# Patient Record
Sex: Male | Born: 1955 | Race: White | Hispanic: No | Marital: Single | Smoking: Current every day smoker
Health system: Southern US, Community
[De-identification: ages and names within clinical notes are randomized; demographics above are authoritative.]

## PROBLEM LIST (undated history)

## (undated) DIAGNOSIS — B1920 Unspecified viral hepatitis C without hepatic coma: Secondary | ICD-10-CM

## (undated) DIAGNOSIS — T148XXD Other injury of unspecified body region, subsequent encounter: Secondary | ICD-10-CM

## (undated) DIAGNOSIS — K6389 Other specified diseases of intestine: Secondary | ICD-10-CM

## (undated) HISTORY — DX: Unspecified viral hepatitis C without hepatic coma: B19.20

## (undated) HISTORY — DX: Other specified diseases of intestine: K63.89

## (undated) HISTORY — DX: Other injury of unspecified body region, subsequent encounter: T14.8XXD

---

## 2004-08-29 ENCOUNTER — Emergency Department: Payer: Self-pay | Admitting: Unknown Physician Specialty

## 2005-08-31 ENCOUNTER — Emergency Department: Payer: Self-pay | Admitting: Emergency Medicine

## 2020-05-28 ENCOUNTER — Telehealth: Payer: Self-pay

## 2020-05-28 ENCOUNTER — Emergency Department: Payer: Self-pay

## 2020-05-28 ENCOUNTER — Other Ambulatory Visit: Payer: Self-pay

## 2020-05-28 ENCOUNTER — Encounter: Payer: Self-pay | Admitting: Emergency Medicine

## 2020-05-28 ENCOUNTER — Emergency Department
Admission: EM | Admit: 2020-05-28 | Discharge: 2020-05-28 | Disposition: A | Discharge status: Home / Self Care | Payer: Self-pay | Attending: Emergency Medicine | Admitting: Emergency Medicine

## 2020-05-28 DIAGNOSIS — C787 Secondary malignant neoplasm of liver and intrahepatic bile duct: Secondary | ICD-10-CM | POA: Insufficient documentation

## 2020-05-28 DIAGNOSIS — B9689 Other specified bacterial agents as the cause of diseases classified elsewhere: Secondary | ICD-10-CM | POA: Insufficient documentation

## 2020-05-28 DIAGNOSIS — G8929 Other chronic pain: Secondary | ICD-10-CM | POA: Insufficient documentation

## 2020-05-28 DIAGNOSIS — N3000 Acute cystitis without hematuria: Secondary | ICD-10-CM | POA: Insufficient documentation

## 2020-05-28 DIAGNOSIS — F17210 Nicotine dependence, cigarettes, uncomplicated: Secondary | ICD-10-CM | POA: Insufficient documentation

## 2020-05-28 DIAGNOSIS — C187 Malignant neoplasm of sigmoid colon: Secondary | ICD-10-CM | POA: Insufficient documentation

## 2020-05-28 LAB — URINALYSIS, COMPLETE (UACMP) WITH MICROSCOPIC
Glucose, UA: NEGATIVE mg/dL
Ketones, ur: 5 mg/dL — AB
Nitrite: POSITIVE — AB
Protein, ur: 100 mg/dL — AB
Specific Gravity, Urine: 1.028 (ref 1.005–1.030)
WBC, UA: >50 WBC/hpf — ABNORMAL HIGH (ref 0–5)
pH: 5 (ref 5.0–8.0)

## 2020-05-28 LAB — COMPREHENSIVE METABOLIC PANEL
ALT: 6 U/L (ref 0–44)
AST: 12 U/L — ABNORMAL LOW (ref 15–41)
Albumin: 3.2 g/dL — ABNORMAL LOW (ref 3.5–5.0)
Alkaline Phosphatase: 53 U/L (ref 38–126)
Anion gap: 9 (ref 5–15)
BUN: 24 mg/dL — ABNORMAL HIGH (ref 8–23)
CO2: 24 mmol/L (ref 22–32)
Calcium: 8.9 mg/dL (ref 8.9–10.3)
Chloride: 99 mmol/L (ref 98–111)
Creatinine, Ser: 0.8 mg/dL (ref 0.61–1.24)
GFR, Estimated: >60 mL/min (ref >60)
Glucose, Bld: 110 mg/dL — ABNORMAL HIGH (ref 70–99)
Potassium: 3.9 mmol/L (ref 3.5–5.1)
Sodium: 132 mmol/L — ABNORMAL LOW (ref 135–145)
Total Bilirubin: 0.5 mg/dL (ref 0.3–1.2)
Total Protein: 8.5 g/dL — ABNORMAL HIGH (ref 6.5–8.1)

## 2020-05-28 LAB — CBC
HCT: 27.4 % — ABNORMAL LOW (ref 39.0–52.0)
Hemoglobin: 8 g/dL — ABNORMAL LOW (ref 13.0–17.0)
MCH: 19.5 pg — ABNORMAL LOW (ref 26.0–34.0)
MCHC: 29.2 g/dL — ABNORMAL LOW (ref 30.0–36.0)
MCV: 66.7 fL — ABNORMAL LOW (ref 80.0–100.0)
Platelets: 620 10*3/uL — ABNORMAL HIGH (ref 150–400)
RBC: 4.11 MIL/uL — ABNORMAL LOW (ref 4.22–5.81)
RDW: 17.5 % — ABNORMAL HIGH (ref 11.5–15.5)
WBC: 11.1 10*3/uL — ABNORMAL HIGH (ref 4.0–10.5)
nRBC: 0 % (ref 0.0–0.2)

## 2020-05-28 LAB — LIPASE, BLOOD: Lipase: 22 U/L (ref 11–51)

## 2020-05-28 MED ORDER — IOHEXOL 300 MG/ML  SOLN
100.0000 mL | Freq: Once | INTRAMUSCULAR | Status: AC | PRN
  Administered 2020-05-28: 100 mL via INTRAVENOUS

## 2020-05-28 MED ORDER — CEPHALEXIN 500 MG PO CAPS: 500.0000 mg | ORAL_CAPSULE | Freq: Four times a day (QID) | ORAL | 0 refills | Status: AC

## 2020-05-28 MED ORDER — OXYCODONE HCL 5 MG PO TABS
5.0000 mg | ORAL_TABLET | Freq: Three times a day (TID) | ORAL | 0 refills | Status: DC | PRN
Start: 2020-05-28 — End: 2020-05-31

## 2020-05-28 MED ORDER — SODIUM CHLORIDE 0.9 % IV SOLN
1.0000 g | Freq: Once | INTRAVENOUS | Status: AC
  Administered 2020-05-28: 1 g via INTRAVENOUS
  Filled 2020-05-28: qty 10

## 2020-05-28 MED ORDER — LACTATED RINGERS IV BOLUS: 1000.0000 mL | Freq: Once | INTRAVENOUS | Status: DC

## 2020-05-28 MED ORDER — MORPHINE SULFATE (PF) 4 MG/ML IV SOLN
4.0000 mg | Freq: Once | INTRAVENOUS | Status: AC
  Administered 2020-05-28: 4 mg via INTRAVENOUS
  Filled 2020-05-28: qty 1

## 2020-05-28 NOTE — ED Notes (Signed)
Patient sister at bedside

## 2020-05-28 NOTE — Telephone Encounter (Signed)
Call was received to the cancer center from Mr. Jose Watts. He was in the ED today and was told he had colon cancer and that he needed to see Dr. Rogue Bussing in clinic. Imaging reviewed. New patient scheduler will work with Dr. 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

## 2020-05-28 NOTE — Discharge Instructions (Signed)
As we discussed, you likely have an advanced cancer of your colon that has spread to your liver and is eroding the wall of your bladder, causing your UTI.  You are being discharged with 2 prescriptions. Keflex antibiotic to take 4 times daily for the next 10 days to treat bladder infection.  Please finish all of these pills. Oxycodone pain medication to use as needed for severe pain.  Use Tylenol for pain and fevers.  Up to 1000 mg per dose, up to 4 times per day.  Do not take more than 4000 mg of Tylenol/acetaminophen within 24 hours..  I have attached the phone numbers for various specialists and for a new PCP.  I would urge you to call all of them and set up appointments soon as possible.  If you develop any significantly worsening symptoms despite the above medications, please return to the ED.

## 2020-05-28 NOTE — ED Triage Notes (Signed)
Pt presents to ED via POV with c/o LLQ since Sept 2020. Pt also c/o diarrhea. Pt presents A&O x4 and ambulatory with [employer] in hand. Pt states pain relieved after having bowel movement. Pt states urination q 1-2 hrs. Pt also c/o dysuria with urination.   Pt also states he feels like he has diabetes due to feeling like he doesn't heal as quickly as he used to and increased cravings for sweets.

## 2020-05-28 NOTE — ED Notes (Signed)
Patient discharged home with sister, patient received discharge papers and prescriptions to pick up. Patient appropriate and cooperative. Vital signs taken. NAD noted.

## 2020-05-28 NOTE — ED Notes (Signed)
First Nurse Note: Pt ambulatory into ED c/o lower abd pain. Pt is in NAD.

## 2020-05-28 NOTE — ED Provider Notes (Signed)
[facility] Emergency Department Provider Note ____________________________________________   First MD Initiated Contact with Patient 05/28/20 [phone]     (approximate)  I have reviewed the triage vital signs and the nursing notes.  HISTORY  Chief Complaint Abdominal Pain   HPI Jose Watts is a 64 y.o. malewho presents to the ED for evaluation of abdominal pain and dysuria  Chart review indicates patient never been in our system.  Patient reports a strong paternal family history of colon cancer.  Patient presents to the ED with acute on chronic LLQ abdominal pain, now superimposed with dysuria and urinary frequency.  Patient denies any fevers, nausea, emesis, flank pain, syncope, chest pain, shortness of breath or cough.  He indicates that he has had many months of intermittent LLQ abdominal pain, acutely worsening over the past couple days.  He reports loose stools throughout this entire timeframe, but denies melena or hematochezia.  Denies upper GI symptoms.  He reports dysuria and urinary frequency for the past few days without hematuria.  He does report a 50 pound weight loss that was unintentional over the past 1 year.  Reports he has a elderly mother at home with dementia that he cares for her and can only stay for a couple hours.  He reports that he was a previous drinker, and continues to be a regular cigarette smoker.  History reviewed. No pertinent past medical history.  There are no problems to display for this patient.   History reviewed. No pertinent surgical history.  Prior to Admission medications   Medication Sig Start Date End Date Taking? Authorizing Provider  cephALEXin (KEFLEX) 500 MG capsule Take 1 capsule (500 mg total) by mouth 4 (four) times daily for 10 days. 05/28/20 06/07/20  Vladimir Crofts, MD  oxyCODONE (ROXICODONE) 5 MG immediate release tablet Take 1 tablet (5 mg total) by mouth every 8 (eight) hours as needed. 05/28/20  05/28/21  Vladimir Crofts, MD    Allergies Patient has no known allergies.  No family history on file.  Social History Social History   Tobacco Use  . Smoking status: Current Every Day Smoker    Types: Cigarettes  . Smokeless tobacco: Former Network engineer Use Topics  . Alcohol use: Not Currently  . Drug use: Yes    Types: Marijuana    Comment: Every 2 days    Review of Systems  Constitutional: No fever/chills.  Positive for unintentional weight loss Eyes: No visual changes. ENT: No sore throat. Cardiovascular: Denies chest pain. Respiratory: Denies shortness of breath. Gastrointestinal:No nausea, no vomiting.  No constipation. Positive for abdominal pain and diarrhea. Genitourinary: Positive for dysuria. Musculoskeletal: Negative for back pain. Skin: Negative for rash. Neurological: Negative for headaches, focal weakness or numbness.  ____________________________________________   PHYSICAL EXAM:  VITAL SIGNS: Vitals:   05/28/20 0959 05/28/20 1140  BP:  130/88  Pulse:  88  Resp: 18 20  Temp:    SpO2:  98%      Constitutional: Alert and oriented. Well appearing and in no acute distress. Eyes: Conjunctivae are normal. PERRL. EOMI. Head: Atraumatic. Nose: No congestion/rhinnorhea. Mouth/Throat: Mucous membranes are dry.  Oropharynx non-erythematous. Neck: No stridor. No cervical spine tenderness to palpation. Cardiovascular: Tachycardic rate, regular rhythm. Grossly normal heart sounds.  Good peripheral circulation. Respiratory: Normal respiratory effort.  No retractions. Lungs CTAB. Gastrointestinal: Soft , nondistended,. No CVA tenderness. Suprapubic and LLQ tenderness to palpation, benign upper abdomen. Musculoskeletal: No lower extremity tenderness nor edema.  No joint effusions.  No signs of acute trauma. Neurologic:  Normal speech and language. No gross focal neurologic deficits are appreciated. No gait instability noted. Skin:  Skin is warm, dry and  intact. No rash noted. Psychiatric: Mood and affect are normal. Speech and behavior are normal.  ____________________________________________   LABS (all labs ordered are listed, but only abnormal results are displayed)  Labs Reviewed  COMPREHENSIVE METABOLIC PANEL - Abnormal; Notable for the following components:      Result Value   Sodium 132 (*)    Glucose, Bld 110 (*)    BUN 24 (*)    Total Protein 8.5 (*)    Albumin 3.2 (*)    AST 12 (*)    All other components within normal limits  CBC - Abnormal; Notable for the following components:   WBC 11.1 (*)    RBC 4.11 (*)    Hemoglobin 8.0 (*)    HCT 27.4 (*)    MCV 66.7 (*)    MCH 19.5 (*)    MCHC 29.2 (*)    RDW 17.5 (*)    Platelets 620 (*)    All other components within normal limits  URINALYSIS, COMPLETE (UACMP) WITH MICROSCOPIC - Abnormal; Notable for the following components:   Color, Urine AMBER (*)    APPearance TURBID (*)    Hgb urine dipstick MODERATE (*)    Bilirubin Urine SMALL (*)    Ketones, ur 5 (*)    Protein, ur 100 (*)    Nitrite POSITIVE (*)    Leukocytes,Ua LARGE (*)    WBC, UA >50 (*)    Bacteria, UA MANY (*)    All other components within normal limits  LIPASE, BLOOD   _________________________________________  RADIOLOGY  ED MD interpretation: CT abdomen/pelvis reviewed by me with evidence of sigmoid mass  Official radiology report(s): CT ABDOMEN PELVIS W CONTRAST  Result Date: 05/28/2020 CLINICAL DATA:  Abdominal pain on the left for several months EXAM: CT ABDOMEN AND PELVIS WITH CONTRAST TECHNIQUE: Multidetector CT imaging of the abdomen and pelvis was performed using the standard protocol following bolus administration of intravenous contrast. CONTRAST:  158mL OMNIPAQUE IOHEXOL 300 MG/ML  SOLN COMPARISON:  None. FINDINGS: Lower chest: Mild scarring is noted in the bases bilaterally. Hepatobiliary: Mild fatty infiltration of the liver is noted. Scattered hypodensities are noted throughout  the liver the majority of which appear to represent cysts. In the caudate lobe however there is a focal 2.5 cm hypodensity with some peripheral enhancement which does not demonstrate filling on delayed images and is suspicious for metastatic disease. A smaller similar lesion is noted anteriorly within the right lobe of the liver best seen on the first image of the delayed series. Gallbladder appears within normal limits. Pancreas: Pancreas is within normal limits. Spleen: Normal in size without focal abnormality. Adrenals/Urinary Tract: Adrenal glands are unremarkable. Kidneys demonstrate a normal enhancement pattern bilaterally. No renal calculi or obstructive changes are seen. Normal excretion is noted on delayed images. Small parapelvic cysts are noted on the left. The bladder is decompressed. Stomach/Bowel: In the midportion of the sigmoid colon there is an area of significant wall thickening and masslike density which extends for several cm in the sigmoid colon. No evidence of perforation is noted. These changes are consistent with a colonic carcinoma and direct visualization with biopsy is recommended. On the coronal imaging the wall of the stomach approaches the superior aspect of the bladder and the possibility of local invasion could not be totally excluded. No findings  to suggest fistulization are noted at this time. The more proximal colon is distended with fecal material consistent with a mild degree of constipation. The appendix is well visualized and within normal limits. Small bowel and stomach appear unremarkable. Vascular/Lymphatic: Aortic atherosclerotic calcifications are noted. No aneurysmal dilatation is noted. No significant lymphadenopathy is identified at this time. Reproductive: Prostate is unremarkable. Other: No abdominal wall hernia or abnormality. No abdominopelvic ascites. Musculoskeletal: No acute or significant osseous findings. IMPRESSION: Changes consistent with sigmoid carcinoma  till proven otherwise. Direct visualization and tissue sampling is recommended. Findings suggestive of local invasion in the superior wall the bladder are noted on the coronal imaging with loss of the fat plane between the colon and bladder. Findings highly suggestive of hepatic metastatic disease as described. Electronically Signed   By: Inez Catalina M.D.   On: 05/28/2020 09:48   ____________________________________________   PROCEDURES and INTERVENTIONS  Procedure(s) performed (including Critical Care):  .1-3 Lead EKG Interpretation Performed by: Vladimir Crofts, MD Authorized by: Vladimir Crofts, MD     Interpretation: abnormal     ECG rate:  108   ECG rate assessment: tachycardic     Rhythm: sinus tachycardia     Ectopy: none     Conduction: normal      Medications  lactated ringers bolus 1,000 mL (0 mLs Intravenous Stopped 05/28/20 0910)  morphine 4 MG/ML injection 4 mg (4 mg Intravenous Given 05/28/20 0907)  iohexol (OMNIPAQUE) 300 MG/ML solution 100 mL (100 mLs Intravenous Contrast Given 05/28/20 0925)  cefTRIAXone (ROCEPHIN) 1 g in sodium chloride 0.9 % 100 mL IVPB (0 g Intravenous Stopped 05/28/20 1030)    ____________________________________________   MDM / ED COURSE   64 year old male presents to the ED with chronic LLQ pain and with acute dysuria, found to have intra-abdominal mass that is eroding into his bladder causing UTI, ultimately amenable to outpatient management.  Normal vital signs on room air.  Exam demonstrates a well-appearing thin patient who has LLQ tenderness to palpation without peritoneal features.  UA with infectious features, in combination with his dysuria, consistent with acute cystitis.  Patient received a single dose of Rocephin and was discharged with course of Keflex for this.  His CT abdomen/pelvis shows extensive intra-abdominal metastases of likely sigmoid mass.  Patient indicates that he has strong suspicion of this, due to his family history of  such, and his chronic weight loss symptoms.  We discussed how he would likely have recurrent UTIs in the setting of the local erosion into his bladder.  Patient is adamant that he will be discharged today to return home to care for his elderly mother.  I provide him with significant resources, to include information for a PCP, urologist, GI physician and oncologist.  We discussed return precautions for the ED and patient is medically stable for discharge home.   Clinical Course as of May 28 1548  Fri May 28, 2020  1045 Reassessed.  Sister now at the bedside.  Explained CT results with evidence of likely colon cancer that is started to spread throughout his abdomen into his liver and eroded through his bladder causing his UTI.  We discussed treatment of his UTI with antibiotics, but strong likelihood that this will continue to recur.  We discussed multiple physicians to follow-up with, including establishing with a PCP, oncologist, urologist and GI physician.  We discussed uncertainty and possible poor prognosis, but needing biopsy and oncology input.  I answered questions.   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   Vladimir Crofts, MD 05/28/20 [phone]

## 2020-05-31 ENCOUNTER — Encounter: Payer: Self-pay | Admitting: Internal Medicine

## 2020-05-31 ENCOUNTER — Inpatient Hospital Stay: Payer: Self-pay | Attending: Internal Medicine | Admitting: Internal Medicine

## 2020-05-31 ENCOUNTER — Inpatient Hospital Stay: Payer: Self-pay

## 2020-05-31 ENCOUNTER — Other Ambulatory Visit: Payer: Self-pay

## 2020-05-31 ENCOUNTER — Inpatient Hospital Stay (HOSPITAL_BASED_OUTPATIENT_CLINIC_OR_DEPARTMENT_OTHER): Payer: Self-pay | Admitting: Hospice and Palliative Medicine

## 2020-05-31 VITALS — BP 115/69 | HR 85 | Temp 99.0°F | Resp 20 | Ht 72.0 in | Wt 137.0 lb

## 2020-05-31 DIAGNOSIS — C187 Malignant neoplasm of sigmoid colon: Secondary | ICD-10-CM

## 2020-05-31 DIAGNOSIS — C787 Secondary malignant neoplasm of liver and intrahepatic bile duct: Secondary | ICD-10-CM | POA: Insufficient documentation

## 2020-05-31 DIAGNOSIS — Z800 Family history of malignant neoplasm of digestive organs: Secondary | ICD-10-CM | POA: Insufficient documentation

## 2020-05-31 DIAGNOSIS — Z599 Problem related to housing and economic circumstances, unspecified: Secondary | ICD-10-CM

## 2020-05-31 DIAGNOSIS — Z833 Family history of diabetes mellitus: Secondary | ICD-10-CM | POA: Insufficient documentation

## 2020-05-31 DIAGNOSIS — F17210 Nicotine dependence, cigarettes, uncomplicated: Secondary | ICD-10-CM | POA: Insufficient documentation

## 2020-05-31 DIAGNOSIS — B1920 Unspecified viral hepatitis C without hepatic coma: Secondary | ICD-10-CM | POA: Insufficient documentation

## 2020-05-31 DIAGNOSIS — F32A Depression, unspecified: Secondary | ICD-10-CM | POA: Insufficient documentation

## 2020-05-31 DIAGNOSIS — G893 Neoplasm related pain (acute) (chronic): Secondary | ICD-10-CM | POA: Insufficient documentation

## 2020-05-31 DIAGNOSIS — Z515 Encounter for palliative care: Secondary | ICD-10-CM

## 2020-05-31 DIAGNOSIS — C7911 Secondary malignant neoplasm of bladder: Secondary | ICD-10-CM | POA: Insufficient documentation

## 2020-05-31 MED ORDER — OXYCODONE HCL 10 MG PO TABS: ORAL_TABLET | ORAL | 0 refills | Status: DC

## 2020-05-31 NOTE — Progress Notes (Signed)
[facility] CONSULT NOTE  Patient Care Team: Patient, No Pcp Per as PCP - General (General Practice) Clent Jacks, RN as Oncology Nurse Navigator  CHIEF COMPLAINTS/PURPOSE OF CONSULTATION: colon cancer/ liver metastases.   #  Oncology History Overview Note  #November 2021- [ER] sigmoid mass with involvement of the bladder liver lesions.  #Hepatitis C untreated; history of alcohol; smoking   Cancer of sigmoid colon ([place])  05/31/2020 Initial Diagnosis   Cancer of sigmoid colon ([place])      HISTORY OF PRESENTING ILLNESS:  Jose Watts 63 y.o.  male history of hepatitis C untreated; smoker/alcohol-has been referred to [place] for further evaluation recommendations for sigmoid mass/liver lesions.  Patient was noted to have a large sigmoid mass with involvement of the bladder also liver lesions on the CT scan the emergency room.  Patient did not want to stay for further work-up in the hospital.  Patient lives with his mother Dewaine Conger care of his mother with dementia]-noted to have left lower quadrant abdominal pain for the last 12 months.  Also noted to have changing stool caliber [pencillike stool] alternating with diarrhea.  Denies any blood in stools.  No previous colonoscopies.  Positive weight loss.  Poor appetite.   Also noted to have increased frequency of urination.  Positive for air bubbles in urine.  Denies any passing stool in urine.   Review of Systems  Constitutional: Positive for weight loss. Negative for chills, diaphoresis, fever and malaise/fatigue.  HENT: Negative for nosebleeds and sore throat.   Eyes: Negative for double vision.  Respiratory: Positive for shortness of breath. Negative for cough, hemoptysis, sputum production and wheezing.   Cardiovascular: Negative for chest pain, palpitations, orthopnea and leg swelling.  Gastrointestinal: Positive for abdominal pain, constipation, diarrhea and nausea. Negative for blood in stool, heartburn, melena  and vomiting.  Genitourinary: Positive for urgency. Negative for dysuria and frequency.  Musculoskeletal: Negative for back pain and joint pain.  Skin: Negative.  Negative for itching and rash.  Neurological: Negative for dizziness, tingling, focal weakness, weakness and headaches.  Endo/Heme/Allergies: Does not bruise/bleed easily.  Psychiatric/Behavioral: Negative for depression. The patient is not nervous/anxious and does not have insomnia.      MEDICAL HISTORY:  Past Medical History:  Diagnosis Date  . Colonic mass   . Delayed wound healing   . Hepatitis C     SURGICAL HISTORY: History reviewed. No pertinent surgical history.  SOCIAL HISTORY: Social History   Socioeconomic History  . Marital status: Single    Spouse name: Not on file  . Number of children: Not on file  . Years of education: Not on file  . Highest education level: Not on file  Occupational History  . Not on file  Tobacco Use  . Smoking status: Current Every Day Smoker    Packs/day: 1.00    Years: 46.00    Pack years: 46.00    Types: Cigarettes  . Smokeless tobacco: Former Network engineer  . Vaping Use: Never used  Substance and Sexual Activity  . Alcohol use: Not Currently    Comment: "history of 12 pack of beer per day" stopped drinking last year  . Drug use: Yes    Types: Marijuana    Comment: Every 2 days  . Sexual activity: Not on file  Other Topics Concern  . Not on file  Social History Narrative   Patient is retired Clinical biochemist.  He currently lives with his mom/takes care of mother with dementia.  History of alcoholism; active smoker.   Social Determinants of Health   Financial Resource Strain:   . Difficulty of Paying Living Expenses: Not on file  Food Insecurity:   . Worried About Charity fundraiser in the Last Year: Not on file  . Ran Out of Food in the Last Year: Not on file  Transportation Needs:   . Lack of Transportation (Medical): Not on file  . Lack of Transportation  (Non-Medical): Not on file  Physical Activity:   . Days of Exercise per Week: Not on file  . Minutes of Exercise per Session: Not on file  Stress:   . Feeling of Stress : Not on file  Social Connections:   . Frequency of Communication with Friends and Family: Not on file  . Frequency of Social Gatherings with Friends and Family: Not on file  . Attends Religious Services: Not on file  . Active Member of Clubs or Organizations: Not on file  . Attends Archivist Meetings: Not on file  . Marital Status: Not on file  Intimate Partner Violence:   . Fear of Current or Ex-Partner: Not on file  . Emotionally Abused: Not on file  . Physically Abused: Not on file  . Sexually Abused: Not on file    FAMILY HISTORY: Family History  Problem Relation Age of Onset  . Diabetes Mother   . Colon cancer Father 69       died at 31    ALLERGIES:  has No Known Allergies.  MEDICATIONS:  Current Outpatient Medications  Medication Sig Dispense Refill  . cephALEXin (KEFLEX) 500 MG capsule Take 1 capsule (500 mg total) by mouth 4 (four) times daily for 10 days. 40 capsule 0  . Oxycodone HCl 10 MG TABS 1 to 2 tablets every 4 to 6 hours as needed for pain 60 tablet 0   No current facility-administered medications for this visit.      Marland Kitchen  PHYSICAL EXAMINATION: ECOG PERFORMANCE STATUS: 2 - Symptomatic, <50% confined to bed  Vitals:   05/31/20 1130  BP: 115/69  Pulse: 85  Resp: 20  Temp: 99 F (37.2 C)   Filed Weights   05/31/20 1140  Weight: 137 lb (62.1 kg)    Physical Exam Constitutional:      Comments: Thin cachectic appearing Caucasian male patient.  Accompanied by sister.  Walk independently.  HENT:     Head: Normocephalic and atraumatic.     Mouth/Throat:     Pharynx: No oropharyngeal exudate.  Eyes:     Pupils: Pupils are equal, round, and reactive to light.  Cardiovascular:     Rate and Rhythm: Normal rate and regular rhythm.  Pulmonary:     Effort: No  respiratory distress.     Breath sounds: No wheezing.     Comments: Decreased air entry bilaterally. Abdominal:     General: Bowel sounds are normal. There is no distension.     Palpations: Abdomen is soft. There is no mass.     Tenderness: There is no abdominal tenderness. There is no guarding or rebound.     Comments: Distended.  Tenderness in the lower quadrant.  Musculoskeletal:        General: No tenderness. Normal range of motion.     Cervical back: Normal range of motion and neck supple.  Skin:    General: Skin is warm.  Neurological:     Mental Status: He is alert and oriented to person, place, and time.  Psychiatric:  Mood and Affect: Affect normal.      LABORATORY DATA:  I have reviewed the data as listed Lab Results  Component Value Date   WBC 11.1 (H) 05/28/2020   HGB 8.0 (L) 05/28/2020   HCT 27.4 (L) 05/28/2020   MCV 66.7 (L) 05/28/2020   PLT 620 (H) 05/28/2020   Recent Labs    05/28/20 0755  NA 132*  K 3.9  CL 99  CO2 24  GLUCOSE 110*  BUN 24*  CREATININE 0.80  CALCIUM 8.9  GFRNONAA >60  PROT 8.5*  ALBUMIN 3.2*  AST 12*  ALT 6  ALKPHOS 53  BILITOT 0.5    RADIOGRAPHIC STUDIES: I have personally reviewed the radiological images as listed and agreed with the findings in the report. CT ABDOMEN PELVIS W CONTRAST  Result Date: 05/28/2020 CLINICAL DATA:  Abdominal pain on the left for several months EXAM: CT ABDOMEN AND PELVIS WITH CONTRAST TECHNIQUE: Multidetector CT imaging of the abdomen and pelvis was performed using the standard protocol following bolus administration of intravenous contrast. CONTRAST:  122mL OMNIPAQUE IOHEXOL 300 MG/ML  SOLN COMPARISON:  None. FINDINGS: Lower chest: Mild scarring is noted in the bases bilaterally. Hepatobiliary: Mild fatty infiltration of the liver is noted. Scattered hypodensities are noted throughout the liver the majority of which appear to represent cysts. In the caudate lobe however there is a focal 2.5  cm hypodensity with some peripheral enhancement which does not demonstrate filling on delayed images and is suspicious for metastatic disease. A smaller similar lesion is noted anteriorly within the right lobe of the liver best seen on the first image of the delayed series. Gallbladder appears within normal limits. Pancreas: Pancreas is within normal limits. Spleen: Normal in size without focal abnormality. Adrenals/Urinary Tract: Adrenal glands are unremarkable. Kidneys demonstrate a normal enhancement pattern bilaterally. No renal calculi or obstructive changes are seen. Normal excretion is noted on delayed images. Small parapelvic cysts are noted on the left. The bladder is decompressed. Stomach/Bowel: In the midportion of the sigmoid colon there is an area of significant wall thickening and masslike density which extends for several cm in the sigmoid colon. No evidence of perforation is noted. These changes are consistent with a colonic carcinoma and direct visualization with biopsy is recommended. On the coronal imaging the wall of the stomach approaches the superior aspect of the bladder and the possibility of local invasion could not be totally excluded. No findings to suggest fistulization are noted at this time. The more proximal colon is distended with fecal material consistent with a mild degree of constipation. The appendix is well visualized and within normal limits. Small bowel and stomach appear unremarkable. Vascular/Lymphatic: Aortic atherosclerotic calcifications are noted. No aneurysmal dilatation is noted. No significant lymphadenopathy is identified at this time. Reproductive: Prostate is unremarkable. Other: No abdominal wall hernia or abnormality. No abdominopelvic ascites. Musculoskeletal: No acute or significant osseous findings. 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 Direct visualization and tissue sampling is recommended. Findings suggestive of local  invasion in the superior wall the bladder are noted on the coronal imaging with loss of the fat plane between the colon and bladder. Findings highly suggestive of hepatic metastatic disease as described. Electronically Signed   By: Inez Catalina M.D.   On: 05/28/2020 09:48    ASSESSMENT & PLAN:   Cancer of sigmoid colon ([facility]) # Sigmoid mass-with involvement of the bladder; and liver metastases-highly concerning for sigmoid malignancy with local invasion and distant metastases.  #Discussed  with the patient and sister-the clinical concerns for advanced/stage IV malignancy.  However biopsy would be recommended for confirmation and also treatment options.  Discussed with the patient and sister that unfortunately advanced malignancy cannot be cured; however could be treated for symptom control/management.   #Given patient's symptoms of partial obstruction/abdominal pain I think is reasonable to be evaluated by surgery for consideration of diverting colostomy/sigmoidoscopy for biopsy.  See discussion below  # Hep C- un untreated  #Abdominal pain-secondary malignancy.  Await evaluation with palliative care.  Discussed with [employer].  #Genetic predisposition-?  Father with stomach cancer in 86s.  Patient be candidate for genetic testing./See below  #History of alcoholism/smoking.   #Depression-we'll consider antidepressants at subsequent visits.  Prognosis/plan of care: After lengthy discussion with patient and sister-patient states that he would like to wait for couple of days to decide if he wants to proceed with any therapeutic options.  States that he wants to weigh his options with a clear mind when his abdominal pain is better controlled.  Above plan of care was discussed with [facility]; Roderic Ovens.  # DISPOSITION:  # labs- today- cbc/cmp/cea; AFP;PT-PTT # follow up TBD- Dr.B  Thank you for allowing me to participate in the care of your pleasant patient. Please do not hesitate to  contact me with questions or concerns in the interim.  # I reviewed the blood work- with the patient in detail; also reviewed the imaging independently [as summarized above]; and with the patient in detail.     All questions were answered. The patient knows to call the clinic with any problems, questions or concerns.    Cammie Sickle, MD 05/31/2020 8:49 PM

## 2020-05-31 NOTE — Assessment & Plan Note (Addendum)
#   Sigmoid mass-with involvement of the bladder; and liver metastases-highly concerning for sigmoid malignancy with local invasion and distant metastases.  #Discussed with the patient and sister-the clinical concerns for advanced/stage IV malignancy.  However biopsy would be recommended for confirmation and also treatment options.  Discussed with the patient and sister that unfortunately advanced malignancy cannot be cured; however could be treated for symptom control/management.   #Given patient's symptoms of partial obstruction/abdominal pain I think is reasonable to be evaluated by surgery for consideration of diverting colostomy/sigmoidoscopy for biopsy.  See discussion below  # Hep C- un untreated  #Abdominal pain-secondary malignancy.  Await evaluation with palliative care.  Discussed with [employer].  #Genetic predisposition-?  Father with stomach cancer in 37s.  Patient be candidate for genetic testing./See below  #History of alcoholism/smoking.   #Depression-we'll consider antidepressants at subsequent visits.  Prognosis/plan of care: After lengthy discussion with patient and sister-patient states that he would like to wait for couple of days to decide if he wants to proceed with any therapeutic options.  States that he wants to weigh his options with a clear mind when his abdominal pain is better controlled.  Above plan of care was discussed with [facility]; Roderic Ovens.  # DISPOSITION:  # labs- today- cbc/cmp/cea; AFP;PT-PTT # follow up TBD- Dr.B  Thank you for allowing me to participate in the care of your pleasant patient. Please do not hesitate to contact me with questions or concerns in the interim.  # I reviewed the blood work- with the patient in detail; also reviewed the imaging independently [as summarized above]; and with the patient in detail.

## 2020-05-31 NOTE — Progress Notes (Signed)
Met with Mr. Jose Watts and his sister, during and after consult with Dr. Rogue Bussing. Introduced Therapist, nutritional and provided contact information for future needs. Mr. Jose Watts lives with his 62 year old mother and does assist with her care. He has been having pain since September 2020 and did not seek care due to fear of catching COVID and giving it to his mother. His sister reports that she urged him to get care over the last year. He admits to losing around 50 lbs. Eating causes him increased abdominal pain. He does have some food insecurity and states he does not have any money. Offered referral to dietician today and he defers at present due to his pain. He was given items today from the food pantry. He is very uncomfortable sitting in the room due to his pain. He will meet with palliative care to address his pain. Will get PSN involved in care. He wants to go home and think about receiving treatment and will contact our office with his decision. Encouraged to call with any questions.

## 2020-05-31 NOTE — Progress Notes (Signed)
[place]  Telephone:([phone] [phone]   Name: GURNEY BALTHAZOR Date: 05/31/2020 MRN: [id number]  DOB: September 30, 1955  Patient Care Team: Patient, No Pcp Per as PCP - General (General Practice) Clent Jacks, RN as Oncology Nurse Navigator    REASON FOR CONSULTATION: Jose Watts is a 64 y.o. male with multiple medical problems including untreated HCV, history of EtOH/tobacco abuse, who was recently found to have a sigmoid mass with liver metastases.  Patient was seen in the ER on 05/28/2020 with left lower quadrant abdominal pain.  Abdominal CT revealed an abdominal mass eroding into the bladder causing UTI.  Patient has had severe pain.  He is referred to palliative care to address goals and manage ongoing symptoms.  SOCIAL HISTORY:     reports that he has been smoking cigarettes. He has a 46.00 pack-year smoking history. He has quit using smokeless tobacco. He reports previous alcohol use. He reports current drug use. Drug: Marijuana.   Patient is married but separated.  He has 2 children.  He lives at home with his elderly mother.  Patient has a sister is involved in his care.  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  ADVANCE DIRECTIVES:  Does not have  CODE STATUS:   PAST MEDICAL HISTORY: Past Medical History:  Diagnosis Date  . Colonic mass   . Delayed wound healing   . Hepatitis C     PAST SURGICAL HISTORY: No past surgical history on file.  HEMATOLOGY/ONCOLOGY HISTORY:  Oncology History   No history exists.    ALLERGIES:  has No Known Allergies.  MEDICATIONS:  Current Outpatient Medications  Medication Sig Dispense Refill  . cephALEXin (KEFLEX) 500 MG capsule Take 1 capsule (500 mg total) by mouth 4 (four) times daily for 10 days. 40 capsule 0  . oxyCODONE (ROXICODONE) 5 MG immediate release tablet Take 1 tablet (5 mg total) by mouth every 8 (eight) hours as needed. (Patient not  taking: Reported on 05/31/2020) 15 tablet 0   No current facility-administered medications for this visit.    VITAL SIGNS: There were no vitals taken for this visit. There were no vitals filed for this visit.  Estimated body mass index is 18.58 kg/m as calculated from the following:   Height as of an earlier encounter on 05/31/20: 6' (1.829 m).   Weight as of an earlier encounter on 05/31/20: 137 lb (62.1 kg).  LABS: CBC:    Component Value Date/Time   WBC 11.1 (H) 05/28/2020 0755   HGB 8.0 (L) 05/28/2020 0755   HCT 27.4 (L) 05/28/2020 0755   PLT 620 (H) 05/28/2020 0755   MCV 66.7 (L) 05/28/2020 0755   Comprehensive Metabolic Panel:    Component Value Date/Time   NA 132 (L) 05/28/2020 0755   K 3.9 05/28/2020 0755   CL 99 05/28/2020 0755   CO2 24 05/28/2020 0755   BUN 24 (H) 05/28/2020 0755   CREATININE 0.80 05/28/2020 0755   GLUCOSE 110 (H) 05/28/2020 0755   CALCIUM 8.9 05/28/2020 0755   AST 12 (L) 05/28/2020 0755   ALT 6 05/28/2020 0755   ALKPHOS 53 05/28/2020 0755   BILITOT 0.5 05/28/2020 0755   PROT 8.5 (H) 05/28/2020 0755   ALBUMIN 3.2 (L) 05/28/2020 0755    RADIOGRAPHIC STUDIES: CT ABDOMEN PELVIS W CONTRAST  Result Date: 05/28/2020 CLINICAL DATA:  Abdominal pain on the left for several months EXAM: CT ABDOMEN AND PELVIS WITH CONTRAST TECHNIQUE: Multidetector  CT imaging of the abdomen and pelvis was performed using the standard protocol following bolus administration of intravenous contrast. CONTRAST:  145mL OMNIPAQUE IOHEXOL 300 MG/ML  SOLN COMPARISON:  None. FINDINGS: Lower chest: Mild scarring is noted in the bases bilaterally. Hepatobiliary: Mild fatty infiltration of the liver is noted. Scattered hypodensities are noted throughout the liver the majority of which appear to represent cysts. In the caudate lobe however there is a focal 2.5 cm hypodensity with some peripheral enhancement which does not demonstrate filling on delayed images and is suspicious for  metastatic disease. A smaller similar lesion is noted anteriorly within the right lobe of the liver best seen on the first image of the delayed series. Gallbladder appears within normal limits. Pancreas: Pancreas is within normal limits. Spleen: Normal in size without focal abnormality. Adrenals/Urinary Tract: Adrenal glands are unremarkable. Kidneys demonstrate a normal enhancement pattern bilaterally. No renal calculi or obstructive changes are seen. Normal excretion is noted on delayed images. Small parapelvic cysts are noted on the left. The bladder is decompressed. Stomach/Bowel: In the midportion of the sigmoid colon there is an area of significant wall thickening and masslike density which extends for several cm in the sigmoid colon. No evidence of perforation is noted. These changes are consistent with a colonic carcinoma and direct visualization with biopsy is recommended. On the coronal imaging the wall of the stomach approaches the superior aspect of the bladder and the possibility of local invasion could not be totally excluded. No findings to suggest fistulization are noted at this time. The more proximal colon is distended with fecal material consistent with a mild degree of constipation. The appendix is well visualized and within normal limits. Small bowel and stomach appear unremarkable. Vascular/Lymphatic: Aortic atherosclerotic calcifications are noted. No aneurysmal dilatation is noted. No significant lymphadenopathy is identified at this time. Reproductive: Prostate is unremarkable. Other: No abdominal wall hernia or abnormality. No abdominopelvic ascites. Musculoskeletal: No acute or significant osseous findings. 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 Direct visualization and tissue sampling is recommended. Findings suggestive of local invasion in the superior wall the bladder are noted on the coronal imaging with loss of the fat plane between the colon and  bladder. Findings highly suggestive of hepatic metastatic disease as described. Electronically Signed   By: Inez Catalina M.D.   On: 05/28/2020 09:48    PERFORMANCE STATUS (ECOG) : 1 - Symptomatic but completely ambulatory  Review of Systems Unless otherwise noted, a complete review of systems is negative.  Physical Exam General: NAD Pulmonary: Unlabored Extremities: no edema, no joint deformities Skin: no rashes Neurological: Weakness but otherwise nonfocal  IMPRESSION: Met with patient and his sister following their visit with Dr. Rogue Bussing.  Both patient and sister recognize that imaging appears consistent with stage IV cancer.  Treatment options were discussed including possible chemotherapy and diverting colostomy given concern for impending obstruction.  However, patient is unsure of how he wants to proceed.  He and family plan to take some time to discuss options.  Patient has had severe pain in the left lower quadrant.  He was given oxycodone in the ER and says that he was taking the medication without significant improvement.  He is now out of the oxycodone.  Will refill and increase the dose to 10 to 20 mg every 4 hours.  Discussed possible option of initiation of a long-acting opioid if needed.  Patient endorses severe financial distress.  He was provided with some food today in the  clinic and referred to social work.  Will cover his pain medications with the [employer].  Will refer to home-based palliative care.  He will benefit from a Education officer, museum following him in the home.  He would also benefit from completing disability and Medicaid application.  Patient has no ACP documents but is interested in appointing his sister to be his healthcare power of attorney.  We discussed CODE STATUS.  Patient wants some time to think about decisions.  PLAN: -Continue current scope of treatment -Increase oxycodone 10 to 20 mg every 4 hours as needed for pain (#60) -ACP documents  provided -Social work referral -Dietitian referral -Home-based palliative care referral -Follow-up virtual visit 1 week   Patient expressed understanding and was in agreement with this plan. He also understands that He can call the clinic at any time with any questions, concerns, or complaints.     Time Total: 30 minutes  Visit consisted of counseling and education dealing with the complex and emotionally intense issues of symptom management and palliative care in the setting of serious and potentially life-threatening illness.Greater than 50%  of this time was spent counseling and coordinating care related to the above assessment and plan.  Signed by: Altha Harm, PhD, NP-C

## 2020-05-31 NOTE — Addendum Note (Signed)
Addended by: Altha Harm R on: 05/31/2020 01:58 PM   Modules accepted: Orders

## 2020-06-02 ENCOUNTER — Telehealth: Payer: Self-pay

## 2020-06-02 NOTE — Telephone Encounter (Signed)
Pts sister called and stated that he has decided to proceed with treatment and does want a colonoscopy. Any further information or questions you have for her she asked that you do not call until after 9:30 am tomorrow. [phone]

## 2020-06-03 ENCOUNTER — Other Ambulatory Visit: Payer: Self-pay | Admitting: General Surgery

## 2020-06-03 ENCOUNTER — Other Ambulatory Visit: Payer: Self-pay

## 2020-06-03 ENCOUNTER — Other Ambulatory Visit: Payer: Self-pay | Admitting: Hospice and Palliative Medicine

## 2020-06-03 MED ORDER — OXYCODONE HCL 10 MG PO TABS: ORAL_TABLET | ORAL | 0 refills | Status: DC

## 2020-06-03 NOTE — Progress Notes (Signed)
I spoke with patient's sister. She reports that pain is better controlled on the oxycodone. However, she requested a refill of the oxycodone as patient will run out over the weekend. Due date would be tomorrow. Will send Rx.

## 2020-06-03 NOTE — Progress Notes (Signed)
Patient with abdominal pain and change in bowel habits. CT suggests malignancy in the sigmoid colon. For unprepped sigmoidoscopy.

## 2020-06-04 ENCOUNTER — Encounter: Payer: Self-pay | Admitting: General Surgery

## 2020-06-04 NOTE — Progress Notes (Signed)
PSN assisting patient with pain medication through the [facility].  PSN left a message to call PSN to discuss his financial concerns.  Awaiting a call back.

## 2020-06-07 ENCOUNTER — Telehealth: Payer: Self-pay | Admitting: Internal Medicine

## 2020-06-07 ENCOUNTER — Telehealth: Payer: Self-pay

## 2020-06-07 ENCOUNTER — Other Ambulatory Visit: Admission: RE | Admit: 2020-06-07 | Payer: Self-pay | Source: Ambulatory Visit

## 2020-06-07 NOTE — Telephone Encounter (Signed)
Spoke to pt's sister- Tina-as per the sister not sure the patient wants to do the chemotherapy; or the port.  In fact she is not sure if he even wants to do the colonoscopy as planned for this Wednesday on 11/25.   She is currently going to patient's home to talk about his preference for the above procedures.  Wants [place] to call around 1:00 to confirm their plan.  Drue Dun- please call the pt's sister and confirm this afternoon.   Thanks GB

## 2020-06-07 NOTE — Progress Notes (Signed)
Discussed with Dr Bary Castilla that the patient's sister, Otila Kluver, has concerns that the patient is using cocaine and she would like a urine drug screen to be completed on the patient prior to his procedure on Wednesday, June 09, 2020.  Per Dr Bary Castilla, I placed an order for a urine drug screen to be completed upon arrival on Wednesday prior to the scheduled procedure.

## 2020-06-07 NOTE — Telephone Encounter (Signed)
On 11/19-had a long discussion with the patient's sister regarding the treatment plan-plan with colonoscopy biopsy/port.  However understands treatments are palliative not curative.  After lengthy discussion patient sister agrees with treatment plan.  Discussed with Dr. Bary Castilla.

## 2020-06-07 NOTE — Telephone Encounter (Signed)
Called to discuss care of Jose Watts with sister, Otila Kluver. She has spoken with him and he will proceed with flex sigmoidoscopy on 11/24. If he can tolerate, he should use a fleets enema one hour prior to his arrival. Otila Kluver is also concerned that he has been using cocaine and wants a urine drug test performed before he is sedated. I have notified Dr. Bary Castilla. At this time he does not want to discuss or pursue port placement. He only wants to proceed with flex sigmoidoscopy and go from there.

## 2020-06-08 ENCOUNTER — Other Ambulatory Visit: Admission: RE | Admit: 2020-06-08 | Payer: Self-pay | Source: Ambulatory Visit

## 2020-06-08 ENCOUNTER — Telehealth: Payer: Self-pay | Admitting: *Deleted

## 2020-06-08 NOTE — Telephone Encounter (Signed)
Call from family member stating that patient is NOT going to do the Colonoscopy tomorrow and that she cannot get in touch with Dr Byrnett;'s office to let them know. She reports that he fell last night and now has blood in his urine. She did not say if he went to ER or not, but I do not see an encounter for a visit to ER. Please advise

## 2020-06-08 NOTE — Telephone Encounter (Signed)
Jose Watts is contacting the patient to discuss the goals of care.

## 2020-06-08 NOTE — Telephone Encounter (Signed)
I spoke with Mr. Jose Watts. He is weak with minimal oral intake over the past several days. He says that he "passed out" in a store yesterday. Offered him a clinic visit versus ER and he declined both. I suspect that he is dehydrated. We talked about the option of supportive care at home with hospice involvement and just foregoing future work-up/treatment. He was indecisive and said that he just wants to focus on eating, drinking, and getting some sleep. He wants to "give it a week" and see how he feels and then readdress decision-making. We also discussed other decision-making including future treatment, ACP, and CODE STATUS. Patient said that he did not want to make any decisions right now and just wanted to readdress next week.  Case and plan discussed with Dr. Rogue Bussing

## 2020-06-09 ENCOUNTER — Encounter: Admission: RE | Payer: Self-pay | Source: Home / Self Care

## 2020-06-09 ENCOUNTER — Ambulatory Visit: Admission: RE | Admit: 2020-06-09 | Payer: Self-pay | Source: Home / Self Care | Admitting: General Surgery

## 2020-06-09 SURGERY — SIGMOIDOSCOPY, FLEXIBLE
Anesthesia: Monitor Anesthesia Care

## 2020-06-09 SURGERY — SIGMOIDOSCOPY, FLEXIBLE

## 2020-06-12 ENCOUNTER — Telehealth: Payer: Self-pay | Admitting: Hematology and Oncology

## 2020-06-12 ENCOUNTER — Other Ambulatory Visit: Payer: Self-pay | Admitting: Hematology and Oncology

## 2020-06-12 DIAGNOSIS — G893 Neoplasm related pain (acute) (chronic): Secondary | ICD-10-CM | POA: Insufficient documentation

## 2020-06-12 MED ORDER — OXYCODONE HCL 10 MG PO TABS: ORAL_TABLET | ORAL | 0 refills | Status: DC

## 2020-06-12 NOTE — Telephone Encounter (Signed)
Re:  Pain medications  Patient's sister called regarding patient's pain medications.  He is running out of his prescription (approximately 4 pills left).  He is taking oxycodone 10 mg tablets 2 every 4 hours for pain (12 total pills in 1 day).  He received an Rx for 90 pills on 06/03/2020.    I discussed sending in an Rx for enough pills to get him to Monday, 06/14/2020, when Dr Rogue Bussing and Altha Harm, NP are available.  An Rx for oxycodone 10 mg tablets 1-2 every 4 hours for pain (#30) was provided.   Lequita Asal, MD

## 2020-06-14 ENCOUNTER — Telehealth: Payer: Self-pay

## 2020-06-14 ENCOUNTER — Other Ambulatory Visit: Payer: Self-pay | Admitting: Hospice and Palliative Medicine

## 2020-06-14 ENCOUNTER — Telehealth: Payer: Self-pay | Admitting: Nurse Practitioner

## 2020-06-14 ENCOUNTER — Other Ambulatory Visit: Payer: Self-pay | Admitting: *Deleted

## 2020-06-14 DIAGNOSIS — G893 Neoplasm related pain (acute) (chronic): Secondary | ICD-10-CM

## 2020-06-14 MED ORDER — OXYCODONE HCL 10 MG PO TABS: ORAL_TABLET | ORAL | 0 refills | Status: DC

## 2020-06-14 NOTE — Telephone Encounter (Signed)
Called patient back as requested, to discuss Palliative referral/services, no answer - left message requesting a return call.  Left my name and call back number.

## 2020-06-14 NOTE — Progress Notes (Signed)
I received a call from patient requesting refill of his oxycodone.  This was refilled over the weekend by Dr. Mike Gip.  Patient reports that he has about 5 tablets left and that he is taking 10 to 12/day.  He says that this is currently controlling his pain well.  He says pain is worse at night.  I discussed adjustment of his pain regimen and the option of starting a long-acting opioid to reduce the need for short acting pain medications.  However, patient was reluctant to make any adjustments to his pain regimen.  Discussed with Dr. Rogue Bussing and will refill oxycodone today.  Patient states that he has no desire to pursue chemotherapy.  As such, we will forego further work-up with colonoscopy.  I strongly recommended hospice involvement but patient states that he is reluctant to let anyone come out to his house.  He says that he will accept hospice when he starts to decline.  He seems to recognize that he is approaching end-of-life.  He was questioning a possible follow-up visit in the clinic in 1 to 2 weeks but was ultimately noncommittal.  We discussed CODE STATUS.  Patient stated clearly and repeatedly that he would not want to be resuscitated nor have his life prolonged artificially on machines.  He was in agreement with DNR/DNI.  Patient requested a DNR order to be mailed to his home.  Also discussed the above with patient's sister.  Plan: -Refill oxycodone, #90 -DNR/DNI -Recommend hospice involvement when patient is in agreement

## 2020-06-14 NOTE — Telephone Encounter (Signed)
Received voicemail from sister, Otila Kluver, requesting pain medication refill. It appears this is already in progress.

## 2020-06-14 NOTE — Telephone Encounter (Signed)
Spoke with patient's sister, Ozzie Hoyle, regarding the Palliative referral/services and she requested that I speak with the patient to schedule.    I then call patient's cell number and spoke with him regarding the Palliative referral and he requested that I call him back in about 30 minutes and then we could talk about Palliative services.

## 2020-06-14 NOTE — Telephone Encounter (Addendum)
Call from [facility] requesting refill of pain medications

## 2020-06-18 ENCOUNTER — Other Ambulatory Visit: Payer: Self-pay | Admitting: Hospice and Palliative Medicine

## 2020-06-18 ENCOUNTER — Other Ambulatory Visit: Payer: Self-pay | Admitting: *Deleted

## 2020-06-18 ENCOUNTER — Other Ambulatory Visit: Payer: Self-pay | Admitting: Internal Medicine

## 2020-06-18 DIAGNOSIS — G893 Neoplasm related pain (acute) (chronic): Secondary | ICD-10-CM

## 2020-06-18 MED ORDER — MORPHINE SULFATE 15 MG PO TABS
15.0000 mg | ORAL_TABLET | ORAL | 0 refills | Status: DC | PRN
Start: 2020-06-18 — End: 2020-06-23

## 2020-06-18 NOTE — Progress Notes (Signed)
Patient's sister called into the clinic stating the patient needed a refill of his oxycodone. I just refilled the oxycodone #90 tablets on Monday and this should have lasted 8 days taking the maximum amount allowed each day.   I called and spoke with patient by phone. He admits to taking more than what is currently prescribed. He says pain has been particularly severe at night. Note that I have talked with patient about starting a long acting opioid to better control pain and he has refused.   I explained that he has to communicate with [place] if the pain is poorly controlled but he cannot take more medication than prescribed.   Case discussed with Dr. Rogue Bussing.   Will rotate patient to MSIR 15mg  Q4H PRN over the weekend.   I again recommended hospice to patient. He refused. He says that he wants to maintain his independence until it is "110% necessary to get help." I explained that hospice would meet his goal of comfort at home, particularly with the expectation of decline as patient is not interested/willing to pursue cancer treatment.   Patient is in agreement to clinic evaluation on Monday or Tuesday of next week. Will plan to readdress pain mgt and hospice at time of clinic visit.   PDMP reviewed  Plan: -MSIR 15mg  Q4H PRN #45 -RTC on Monday  Case and plan discussed with Dr. Rogue Bussing

## 2020-06-18 NOTE — Telephone Encounter (Signed)
Patient will be out of medicine by noon tomorrow

## 2020-06-21 ENCOUNTER — Inpatient Hospital Stay: Payer: Self-pay | Admitting: Hospice and Palliative Medicine

## 2020-06-21 ENCOUNTER — Inpatient Hospital Stay: Payer: Self-pay | Attending: Internal Medicine | Admitting: Internal Medicine

## 2020-06-21 ENCOUNTER — Telehealth: Payer: Self-pay

## 2020-06-21 NOTE — Telephone Encounter (Signed)
Spoke with patient's sister Otila Kluver and scheduled a in-person Palliative Consult for 07/01/20 @ [facility] screening was negative. No pets in home. Patient lives with mother. Consent obtained; updated Outlook/Netsmart/Team List and Epic.

## 2020-06-23 ENCOUNTER — Telehealth: Payer: Self-pay | Admitting: Hospice and Palliative Medicine

## 2020-06-23 ENCOUNTER — Telehealth: Payer: Self-pay

## 2020-06-23 ENCOUNTER — Other Ambulatory Visit: Payer: Self-pay

## 2020-06-23 MED ORDER — OXYCODONE HCL 20 MG PO TABS
10.0000 mg | ORAL_TABLET | ORAL | 0 refills | Status: DC | PRN
Start: 2020-06-25 — End: 2020-07-02

## 2020-06-23 NOTE — Telephone Encounter (Signed)
Pts sister called and stated that pt has been constipated for 3 days. She said that he was frustrated and wanted to know why his pain medication was changed. I asked the sister if he has tried any OTC medications for constipation. She said that he has not tried anything and he does not have the money to get medication. I spoke with Josh to let him know what was going on. He will reach out to patient in regards to concerns.

## 2020-06-23 NOTE — Telephone Encounter (Signed)
Spoke with patient by phone.  Note that he no showed 2 clinic appointments on Monday.  Patient states that he is trying to get his affairs in order.  He tells me that he does not like the MSIR and feels that it would lead him to develop constipation.  I explained that any opioid-based pain medications can cause constipation.  He reports that he felt that the oxycodone was better controlling his pain.  I again explained why we had to rotate to the [facility] as he was taking too many of the oxycodone.  Patient counted his morphine while I was on the phone and states that he has 18 left as of today.  This does seem to be a reasonable quantity given the fill date.  I again suggested that patient start a long-acting opioid but he remains adamantly opposed to the idea.  He stated that he felt this would mask the progression of the cancer.  We will try to rotate back to oxycodone.  I will increase the dose to 20 mg tablets (patient can take half to one tablet) and lessen the overall quantity.  I discussed his constipation in detail and suggested daily laxatives.  His daughter gave him a Dulcolax earlier today.  I again discussed the recommendation of hospice involvement.  Patient was more open to the idea today.  He agreed to have me call hospice and see if they could come out to the house next Tuesday (12/14).  He asked that they call his home number [phone]) to arrange the visit.  He would like his daughter present at the time of the hospice visit.  Note that I also spoke with his daughter on the phone and explained all of the above in detail.  She verbalized agreement with the plan.  Plan: -Oxycodone 20 mg tablets (10 to 20 mg) every 4 hours as needed, #60, fill date on 06/25/2020 -Daily bowel regimen -Order for hospice called to [facility]

## 2020-07-01 ENCOUNTER — Other Ambulatory Visit: Payer: Self-pay

## 2020-07-01 ENCOUNTER — Telehealth: Payer: Self-pay | Admitting: Nurse Practitioner

## 2020-07-01 ENCOUNTER — Other Ambulatory Visit: Payer: Self-pay | Admitting: Nurse Practitioner

## 2020-07-01 NOTE — Telephone Encounter (Signed)
Called patient to let him know that we were cancelling the Palliative Consult this morning due to [facility] RN was coming out to see him this afternoon, no answer - I had to leave him a message.   I then called and spoke with patient's daughter, Lawerance Bach to let her know and she said that patient is asleep right now and that she would let him know.  Told her that I would call back to reschedule a visit if he was not admitted to Hospice services.

## 2020-07-02 ENCOUNTER — Other Ambulatory Visit: Payer: Self-pay | Admitting: Hospice and Palliative Medicine

## 2020-07-02 MED ORDER — OXYCODONE HCL 20 MG PO TABS
10.0000 mg | ORAL_TABLET | ORAL | 0 refills | Status: DC | PRN
Start: 2020-07-02 — End: 2020-07-12

## 2020-07-02 MED ORDER — OXYCODONE HCL 20 MG PO TABS
10.0000 mg | ORAL_TABLET | ORAL | 0 refills | Status: DC | PRN
Start: 2020-07-02 — End: 2020-07-02

## 2020-07-02 NOTE — Progress Notes (Addendum)
I spoke with patient's hospice nurse. Patient will run out of oxycodone over the weekend. He had oxycodone filled on 12/10 and should have 12 left if he used the maximum quantity each day (out on 12/19?). Request was made through hospice to switch back to 10mg  tablets. However, given patient's refusal to start a LAO, I think it is more appropriate to limit his total quantity. He can continue to half the tablets to equal 10mg  dose as appropriate. Hospice will ensure that patient has a pill splitter in the home. Again, I would recommend starting patient on a long-acting opioid to better manage his pain around the clock and reduce his overall pill burden.   Plan: -Refill oxycodone 20mg  (10-20mg ) Q4H PRN for pain, #60, fill date should be on 12/19, Next rx due 12/28 -Would recommend starting LAO if patient will allow -Continue hospice care at home  Addendum: Rx sent to [facility] by mistake. I resent to [facility] and called [facility] to cancel the previous prescription.

## 2020-07-02 NOTE — Addendum Note (Signed)
Addended by: Altha Harm R on: 07/02/2020 11:27 AM   Modules accepted: Orders

## 2020-07-12 ENCOUNTER — Other Ambulatory Visit: Payer: Self-pay | Admitting: Hospice and Palliative Medicine

## 2020-07-12 MED ORDER — OXYCODONE HCL 20 MG PO TABS
10.0000 mg | ORAL_TABLET | ORAL | 0 refills | Status: DC | PRN
Start: 2020-07-12 — End: 2020-07-19

## 2020-07-12 NOTE — Progress Notes (Signed)
Spoke with patient's hospice nurse, April.  She visited with patient in the home today.  Pill count appropriately showed 3 oxycodone left.  PDMP reviewed.  Will refill oxycodone.  Note that she spoke with him again about option of starting a long-acting opioid and patient continues to refuse.  He reports that pain is well controlled as long as he takes oxycodone every 4 hours around-the-clock.

## 2020-07-19 ENCOUNTER — Other Ambulatory Visit: Payer: Self-pay | Admitting: *Deleted

## 2020-07-19 MED ORDER — OXYCODONE HCL 20 MG PO TABS
10.0000 mg | ORAL_TABLET | ORAL | 0 refills | Status: DC | PRN
Start: 2020-07-19 — End: 2020-07-19

## 2020-07-19 MED ORDER — OXYCODONE HCL 20 MG PO TABS
10.0000 mg | ORAL_TABLET | ORAL | 0 refills | Status: DC | PRN
Start: 2020-07-19 — End: 2020-07-26

## 2020-07-19 NOTE — Telephone Encounter (Signed)
Prescription was sent to wrong pharmacy.

## 2020-07-26 ENCOUNTER — Other Ambulatory Visit: Payer: Self-pay | Admitting: Hospice and Palliative Medicine

## 2020-07-26 MED ORDER — OXYCODONE HCL 20 MG PO TABS
10.0000 mg | ORAL_TABLET | ORAL | 0 refills | Status: DC | PRN
Start: 2020-07-26 — End: 2020-07-29

## 2020-07-26 NOTE — Progress Notes (Signed)
I received a call from patient's hospice nurse, April. Oxycodone is requested to be refilled. PDMP reviewed.

## 2020-07-29 ENCOUNTER — Other Ambulatory Visit: Payer: Self-pay | Admitting: Hospice and Palliative Medicine

## 2020-07-29 MED ORDER — OXYCODONE HCL 20 MG PO TABS
10.0000 mg | ORAL_TABLET | ORAL | 0 refills | Status: DC | PRN
Start: 2020-07-31 — End: 2020-08-09

## 2020-07-29 NOTE — Progress Notes (Signed)
I received a message from patient's family requesting refill of oxycodone early due to risk of inclement weather.  The current weather forecast projects snow on Sunday into Monday of next week.  Oxycodone was last refilled on 1/10 for 10-day supply.  Next Rx would be due on 1/19.  I will agree to early refill given risk of snow.  I called and left a message for patient's hospice nurse.

## 2020-08-09 ENCOUNTER — Other Ambulatory Visit: Payer: Self-pay | Admitting: Hospice and Palliative Medicine

## 2020-08-09 MED ORDER — OXYCODONE HCL 20 MG PO TABS: 20.0000 mg | ORAL_TABLET | ORAL | 0 refills | Status: DC | PRN

## 2020-08-09 NOTE — Progress Notes (Signed)
Received a call from patient's hospice nurse, April.  She says patient is out of oxycodone.  He had to call EMS over the weekend due to severe and worsening pain.  Patient refused transport to the ER.  Reportedly, pain is averaging 6-8 out of 10 but will decrease to 0 after he utilizes oxycodone.  However, the oxycodone is short-lived often wearing off after 3 to 4 hours.  He is on occasion taking half a tablet of oxycodone in between 4-hour dosing.  Through nurse, I again discussed the option and recommendation of starting a long-acting opioid.  However, patient adamantly refuses.  He became somewhat belligerent on the phone cussing at nurse.  Patient is declining.  His oral intake is minimal.  He is starting to have more difficulty ambulating and is now using a bedside commode instead of walking to the bathroom.  Abdomen is distended but patient not interested in follow-up.  Additionally, trial of dexamethasone could be considered to help with capsular pain from liver mets.  However, I doubt patient will consent to this.  Plan: -Refill oxycodone 20 mg tablets and increase frequency every 3 hours (#60) -Nurse to have follow-up visit later this week -Continue daily bowel regimen -Would recommend trial of transdermal fentanyl versus oral dexamethasone

## 2020-08-13 ENCOUNTER — Other Ambulatory Visit: Payer: Self-pay | Admitting: Hospice and Palliative Medicine

## 2020-08-13 MED ORDER — OXYCODONE HCL 20 MG PO TABS: 20.0000 mg | ORAL_TABLET | ORAL | 0 refills | Status: DC | PRN

## 2020-08-13 MED ORDER — SENNOSIDES-DOCUSATE SODIUM 8.6-50 MG PO TABS: 1.0000 | ORAL_TABLET | Freq: Every day | ORAL | 2 refills | Status: AC

## 2020-08-13 NOTE — Progress Notes (Signed)
I spoke with patient's hospice nurse.  She did a pill count today.  Daughter has been keeping a pain journal but despite that there 7.5 oxycodone tablets unaccounted for that patient feels like he took in addition to what the daughter has given him.  Daughter is still giving him medication every 3 hours.  We again discussed the recommendation for adding a long-acting pain medication but patient remains reluctant.  Discussed constipation management.  Patient has hemorrhoids from straining.  Will add senna S daily.

## 2020-08-16 ENCOUNTER — Other Ambulatory Visit: Payer: Self-pay | Admitting: *Deleted

## 2020-08-16 MED ORDER — OXYCODONE HCL 20 MG PO TABS: 20.0000 mg | ORAL_TABLET | ORAL | 0 refills | Status: DC | PRN

## 2020-08-23 ENCOUNTER — Other Ambulatory Visit: Payer: Self-pay | Admitting: *Deleted

## 2020-08-23 ENCOUNTER — Other Ambulatory Visit: Payer: Self-pay | Admitting: Hospice and Palliative Medicine

## 2020-08-23 MED ORDER — OXYCODONE HCL 20 MG PO TABS: 20.0000 mg | ORAL_TABLET | ORAL | 0 refills | Status: DC | PRN

## 2020-08-23 NOTE — Progress Notes (Signed)
I spoke with patient's hospice nurse.  Patient requested refill of his oxycodone.  He is out as of this morning.  Will refill and increase quantity to 75 tablets with plan for nursing visit later this week to conduct a pill count.

## 2020-08-31 ENCOUNTER — Other Ambulatory Visit: Payer: Self-pay | Admitting: Hospice and Palliative Medicine

## 2020-08-31 MED ORDER — OXYCODONE HCL 20 MG PO TABS: 20.0000 mg | ORAL_TABLET | ORAL | 0 refills | Status: DC | PRN

## 2020-08-31 NOTE — Progress Notes (Signed)
I spoke with patient's hospice nurse. Pill counts have been appropriate. Patient still reluctant to start LAO.

## 2020-09-07 ENCOUNTER — Other Ambulatory Visit: Payer: Self-pay | Admitting: Hospice and Palliative Medicine

## 2020-09-07 MED ORDER — OXYCODONE HCL 20 MG PO TABS: 20.0000 mg | ORAL_TABLET | ORAL | 0 refills | Status: DC | PRN

## 2020-09-07 NOTE — Progress Notes (Signed)
Spoke with hospice nurse. Pill counts accurate. Refill of oxycodone requested.

## 2020-09-17 ENCOUNTER — Other Ambulatory Visit: Payer: Self-pay | Admitting: Hospice and Palliative Medicine

## 2020-09-17 MED ORDER — OXYCODONE HCL 20 MG PO TABS: 20.0000 mg | ORAL_TABLET | ORAL | 0 refills | Status: DC | PRN

## 2020-09-17 NOTE — Progress Notes (Signed)
I spoke with hospice nurse. She reports pain has been better controlled. Patient has at times been using half tablets of the oxycodone. Refill requested.

## 2020-09-27 ENCOUNTER — Other Ambulatory Visit: Payer: Self-pay | Admitting: Hospice and Palliative Medicine

## 2020-09-27 MED ORDER — OXYCODONE HCL 20 MG PO TABS: 20.0000 mg | ORAL_TABLET | ORAL | 0 refills | Status: DC | PRN

## 2020-10-05 ENCOUNTER — Other Ambulatory Visit: Payer: Self-pay | Admitting: Hospice and Palliative Medicine

## 2020-10-05 MED ORDER — OXYCODONE HCL 20 MG PO TABS: 20.0000 mg | ORAL_TABLET | ORAL | 0 refills | Status: AC | PRN

## 2020-10-05 NOTE — Progress Notes (Signed)
Oxycodone refill requested by hospice. PDMP reviewed.

## 2020-10-08 ENCOUNTER — Telehealth: Payer: Self-pay | Admitting: Hospice and Palliative Medicine

## 2020-10-08 MED ORDER — MORPHINE SULFATE (CONCENTRATE) 10 MG /0.5 ML PO SOLN: 10.0000 mg | ORAL | 0 refills | Status: AC | PRN

## 2020-10-08 NOTE — Telephone Encounter (Signed)
I received a call from patient's hospice nurse.  Patient is declining and started using morphine from his comfort kit.  Requested by hospice to send in Rx for morphine elixir.  Will send to pharmacy.

## 2020-10-15 DEATH — deceased

## 2021-04-20 IMAGING — CT CT ABD-PELV W/ CM
2 of 5 series · 15 of 46 positions shown, 17 images · IV contrast (APPLIED)
Comparison: None.

CLINICAL DATA: Abdominal pain on the left for several months

EXAM:
CT ABDOMEN AND PELVIS WITH CONTRAST
TECHNIQUE: Multidetector CT imaging of the abdomen and pelvis was performed
using the standard protocol following bolus administration of
intravenous contrast.
CONTRAST:  100mL OMNIPAQUE IOHEXOL 300 MG/ML  SOLN

[Series 2: routine abd/pel with · axial · 0.69mm/px · z∈[-767,-362]mm · 12 of 93 slices shown, 14 images]
[im 6/93  soft-tissue]
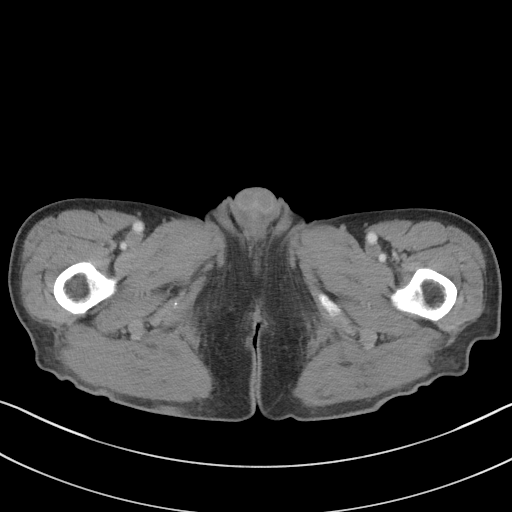
[im 6/93  bone]
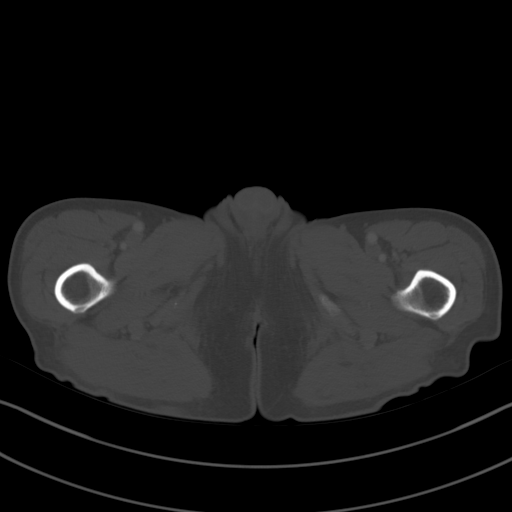
[im 16/93  soft-tissue]
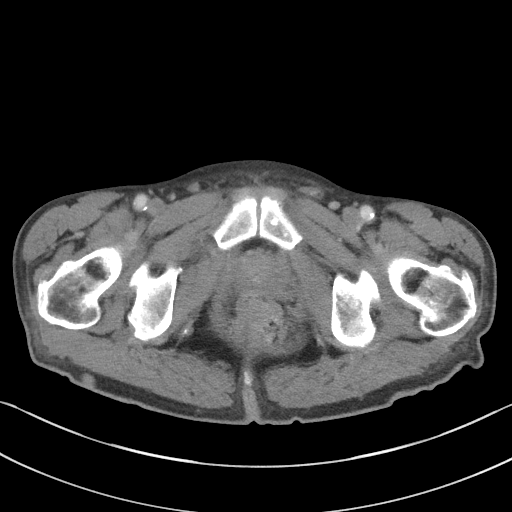
[im 21/93  soft-tissue]
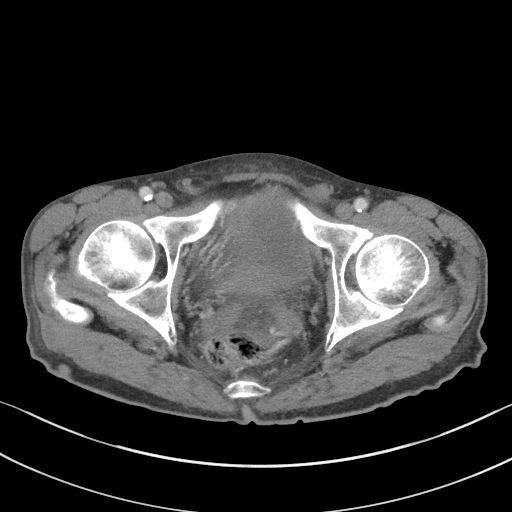
[im 26/93  soft-tissue]
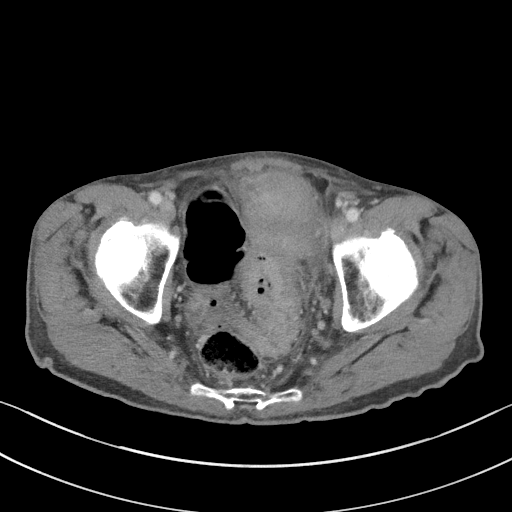
[im 36/93  soft-tissue]
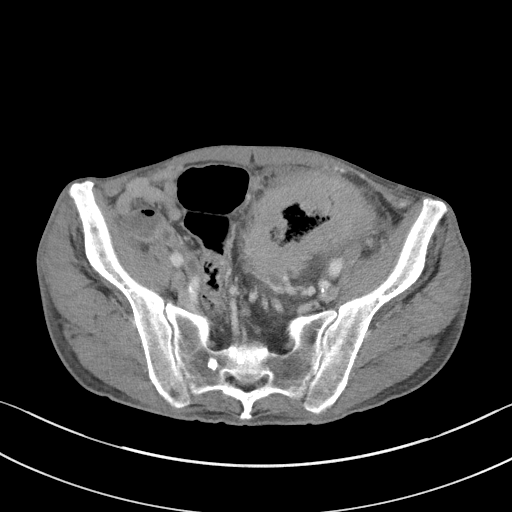
[im 41/93  soft-tissue]
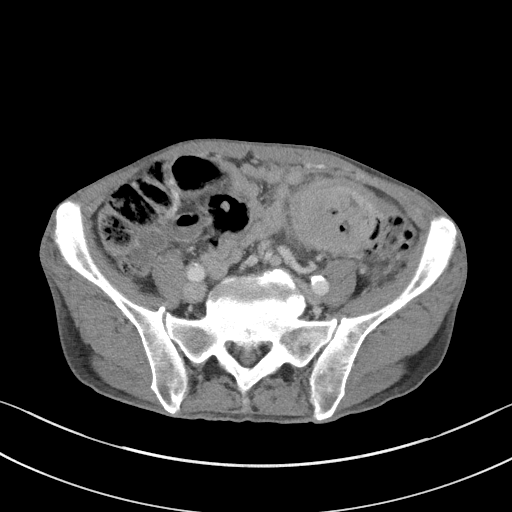
[im 52/93  soft-tissue]
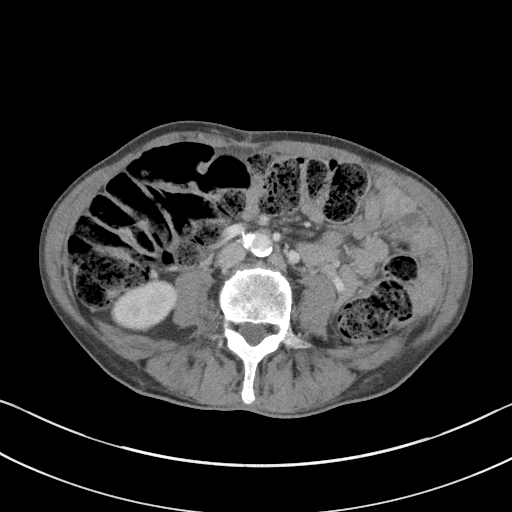
[im 57/93  soft-tissue]
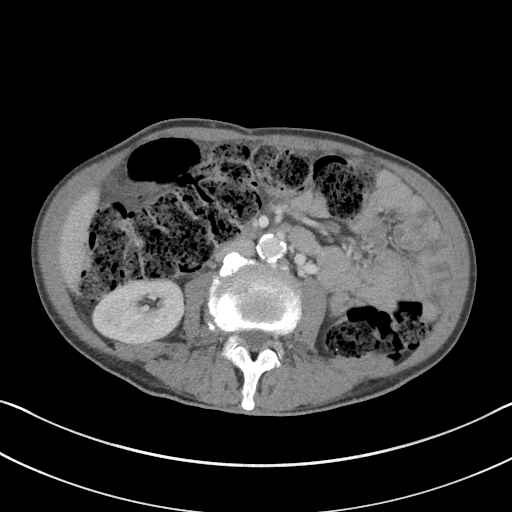
[im 67/93  soft-tissue]
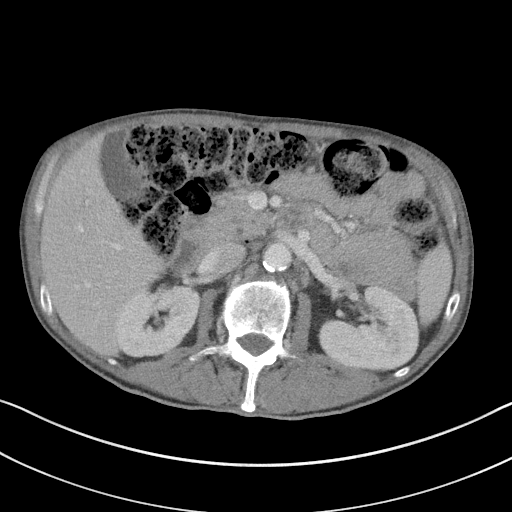
[im 67/93  bone]
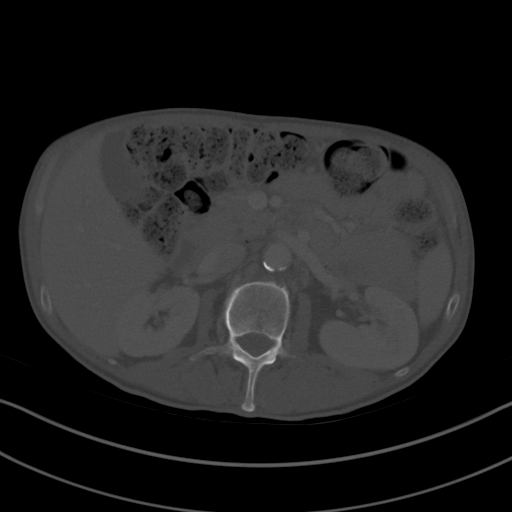
[im 72/93  soft-tissue]
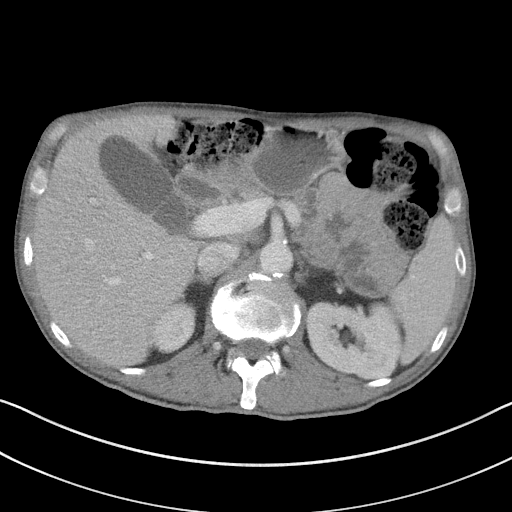
[im 77/93  soft-tissue]
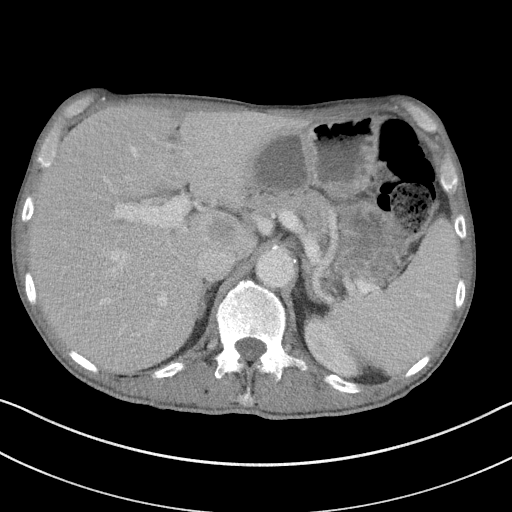
[im 87/93  soft-tissue]
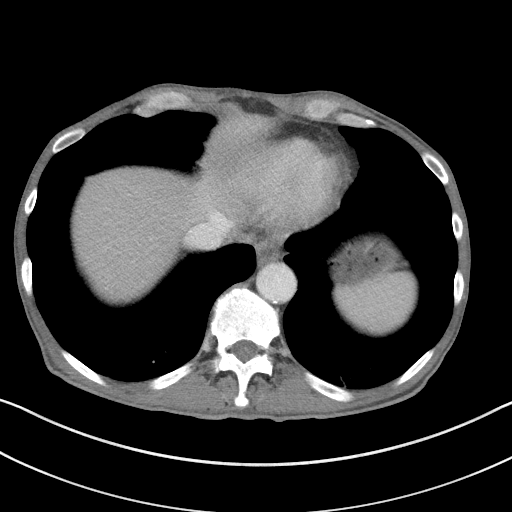

[Series 6: coronal st · coronal · 0.68mm/px · 3 of 79 slices shown]
[im 27/79  soft-tissue]
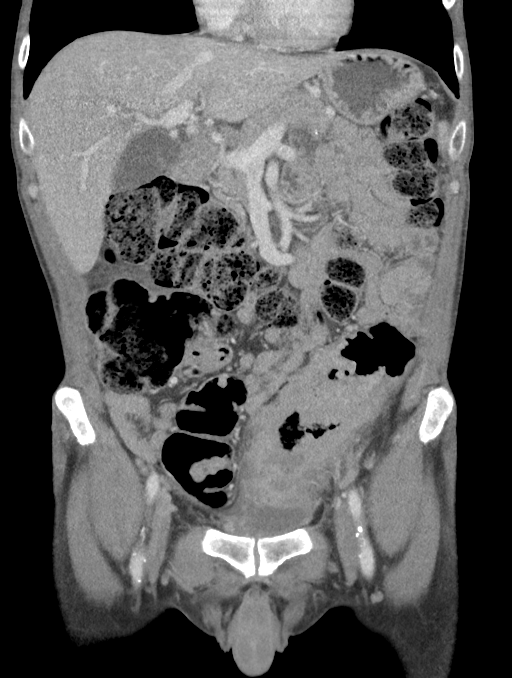
[im 35/79  soft-tissue]
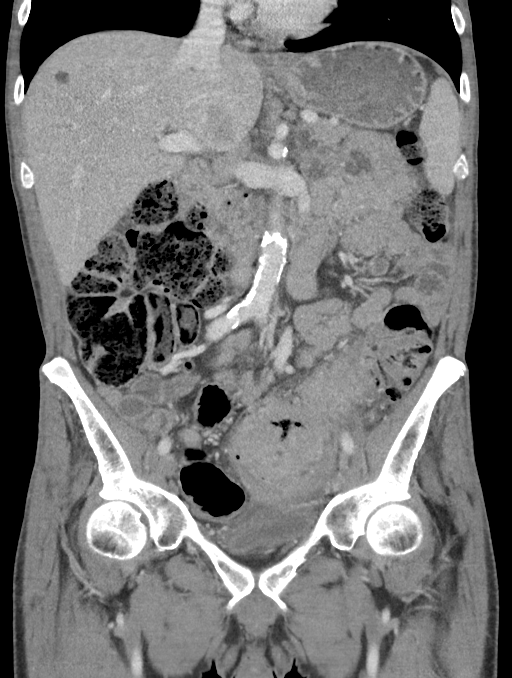
[im 44/79  soft-tissue]
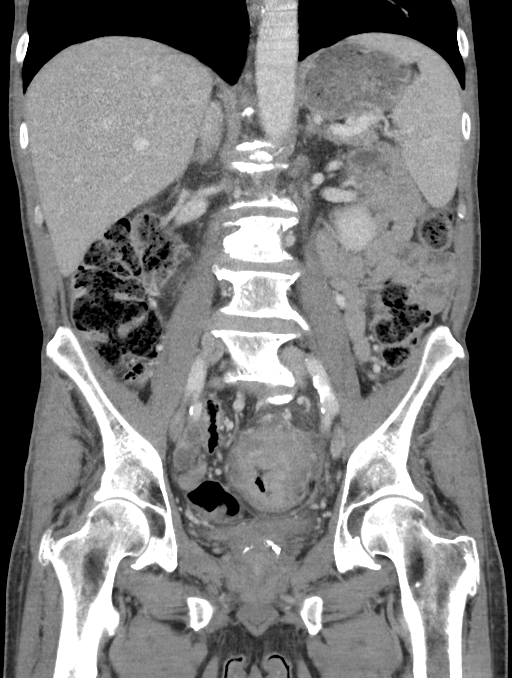

[15 of 46 positions shown; findings below may reference images not displayed]

FINDINGS: Lower chest: Mild scarring is noted in the bases bilaterally.

Hepatobiliary: Mild fatty infiltration of the liver is noted.
Scattered hypodensities are noted throughout the liver the majority
of which appear to represent cysts. In the caudate lobe however
there is a focal 2.5 cm hypodensity with some peripheral enhancement
which does not demonstrate filling on delayed images and is
suspicious for metastatic disease. A smaller similar lesion is noted
anteriorly within the right lobe of the liver best seen on the first
image of the delayed series. Gallbladder appears within normal
limits.

Pancreas: Pancreas is within normal limits.

Spleen: Normal in size without focal abnormality.

Adrenals/Urinary Tract: Adrenal glands are unremarkable. Kidneys
demonstrate a normal enhancement pattern bilaterally. No renal
calculi or obstructive changes are seen. Normal excretion is noted
on delayed images. Small parapelvic cysts are noted on the left. The
bladder is decompressed.

Stomach/Bowel: In the midportion of the sigmoid colon there is an
area of significant wall thickening and masslike density which
extends for several cm in the sigmoid colon. No evidence of
perforation is noted. These changes are consistent with a colonic
carcinoma and direct visualization with biopsy is recommended. On
the coronal imaging the wall of the stomach approaches the superior
aspect of the bladder and the possibility of local invasion could
not be totally excluded. No findings to suggest fistulization are
noted at this time. The more proximal colon is distended with fecal
material consistent with a mild degree of constipation. The appendix
is well visualized and within normal limits. Small bowel and stomach
appear unremarkable.

Vascular/Lymphatic: Aortic atherosclerotic calcifications are noted.
No aneurysmal dilatation is noted. No significant lymphadenopathy is
identified at this time.

Reproductive: Prostate is unremarkable.

Other: No abdominal wall hernia or abnormality. No abdominopelvic
ascites.

Musculoskeletal: No acute or significant osseous findings.
IMPRESSION: Changes consistent with sigmoid carcinoma till proven otherwise.
Direct visualization and tissue sampling is recommended. Findings
suggestive of local invasion in the superior wall the bladder are
noted on the coronal imaging with loss of the fat plane between the
colon and bladder.

Findings highly suggestive of hepatic metastatic disease as
described.
